# Patient Record
Sex: Female | Born: 1970 | Race: White | Hispanic: No | Marital: Married | Smoking: Never smoker
Health system: Southern US, Community
[De-identification: ages and names within clinical notes are randomized; demographics above are authoritative.]

## PROBLEM LIST (undated history)

## (undated) DIAGNOSIS — F1010 Alcohol abuse, uncomplicated: Secondary | ICD-10-CM

## (undated) DIAGNOSIS — K859 Acute pancreatitis without necrosis or infection, unspecified: Secondary | ICD-10-CM

## (undated) DIAGNOSIS — I10 Essential (primary) hypertension: Secondary | ICD-10-CM

## (undated) DIAGNOSIS — R7401 Elevation of levels of liver transaminase levels: Secondary | ICD-10-CM

## (undated) DIAGNOSIS — K760 Fatty (change of) liver, not elsewhere classified: Secondary | ICD-10-CM

## (undated) DIAGNOSIS — K449 Diaphragmatic hernia without obstruction or gangrene: Secondary | ICD-10-CM

## (undated) DIAGNOSIS — R160 Hepatomegaly, not elsewhere classified: Secondary | ICD-10-CM

## (undated) DIAGNOSIS — K8020 Calculus of gallbladder without cholecystitis without obstruction: Secondary | ICD-10-CM

## (undated) HISTORY — DX: Calculus of gallbladder without cholecystitis without obstruction: K80.20

## (undated) HISTORY — DX: Diaphragmatic hernia without obstruction or gangrene: K44.9

## (undated) HISTORY — DX: Alcohol abuse, uncomplicated: F10.10

## (undated) HISTORY — DX: Acute pancreatitis without necrosis or infection, unspecified: K85.90

## (undated) HISTORY — DX: Fatty (change of) liver, not elsewhere classified: K76.0

## (undated) HISTORY — PX: CHOLECYSTECTOMY: SHX55

## (undated) HISTORY — DX: Hepatomegaly, not elsewhere classified: R16.0

## (undated) HISTORY — DX: Essential (primary) hypertension: I10

## (undated) HISTORY — DX: Elevation of levels of liver transaminase levels: R74.01

---

## 2004-03-10 HISTORY — PX: BACK SURGERY: SHX140

## 2006-12-29 ENCOUNTER — Emergency Department (HOSPITAL_COMMUNITY): Admission: EM | Admit: 2006-12-29 | Discharge: 2006-12-29 | Payer: Self-pay | Admitting: Emergency Medicine

## 2009-05-03 ENCOUNTER — Ambulatory Visit (HOSPITAL_COMMUNITY): Admission: RE | Admit: 2009-05-03 | Discharge: 2009-05-03 | Payer: Self-pay | Admitting: Obstetrics & Gynecology

## 2009-05-10 ENCOUNTER — Ambulatory Visit: Payer: Self-pay | Admitting: Obstetrics & Gynecology

## 2009-05-16 ENCOUNTER — Encounter: Payer: Self-pay | Admitting: Obstetrics and Gynecology

## 2009-05-16 ENCOUNTER — Ambulatory Visit: Payer: Self-pay | Admitting: Obstetrics and Gynecology

## 2009-05-21 ENCOUNTER — Ambulatory Visit: Payer: Self-pay | Admitting: Obstetrics & Gynecology

## 2009-05-21 LAB — CONVERTED CEMR LAB

## 2009-05-22 ENCOUNTER — Encounter: Payer: Self-pay | Admitting: Obstetrics & Gynecology

## 2009-05-22 LAB — CONVERTED CEMR LAB
ALT: 14 units/L (ref 0–35)
AST: 20 units/L (ref 0–37)
Alkaline Phosphatase: 147 units/L — ABNORMAL HIGH (ref 39–117)
BUN: 8 mg/dL (ref 6–23)
CO2: 23 meq/L (ref 19–32)
Collection Interval-CRCL: 24 hr
Creatinine Clearance: 119 mL/min — ABNORMAL HIGH (ref 75–115)
Creatinine, Urine: 84.3 mg/dL
Glucose, Bld: 98 mg/dL (ref 70–99)
Hemoglobin: 12.2 g/dL (ref 12.0–15.0)
MCHC: 31.5 g/dL (ref 30.0–36.0)
Platelets: 224 10*3/uL (ref 150–400)
RBC: 4.97 M/uL (ref 3.87–5.11)
Total Bilirubin: 0.3 mg/dL (ref 0.3–1.2)
Uric Acid, Serum: 4.1 mg/dL (ref 2.4–7.0)

## 2009-05-24 ENCOUNTER — Ambulatory Visit: Payer: Self-pay | Admitting: Advanced Practice Midwife

## 2009-05-24 ENCOUNTER — Inpatient Hospital Stay (HOSPITAL_COMMUNITY): Admission: RE | Admit: 2009-05-24 | Discharge: 2009-05-26 | Payer: Self-pay | Admitting: Obstetrics & Gynecology

## 2009-09-21 ENCOUNTER — Emergency Department (HOSPITAL_COMMUNITY): Admission: EM | Admit: 2009-09-21 | Discharge: 2009-09-21 | Payer: Self-pay | Admitting: Emergency Medicine

## 2010-03-31 ENCOUNTER — Encounter: Payer: Self-pay | Admitting: Obstetrics & Gynecology
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
## 2010-06-03 LAB — POCT URINALYSIS DIP (DEVICE)
Bilirubin Urine: NEGATIVE
Glucose, UA: NEGATIVE mg/dL
Hgb urine dipstick: NEGATIVE
Ketones, ur: NEGATIVE mg/dL
Ketones, ur: NEGATIVE mg/dL
Nitrite: NEGATIVE
pH: 7 (ref 5.0–8.0)

## 2010-06-03 LAB — LACTATE DEHYDROGENASE: LDH: 177 U/L (ref 94–250)

## 2010-06-03 LAB — COMPREHENSIVE METABOLIC PANEL
Alkaline Phosphatase: 158 U/L — ABNORMAL HIGH (ref 39–117)
BUN: 8 mg/dL (ref 6–23)
CO2: 23 mEq/L (ref 19–32)
GFR calc non Af Amer: >60 mL/min (ref >60)
Glucose, Bld: 68 mg/dL — ABNORMAL LOW (ref 70–99)
Potassium: 3.5 mEq/L (ref 3.5–5.1)
Total Bilirubin: 0.1 mg/dL — ABNORMAL LOW (ref 0.3–1.2)
Total Protein: 6.3 g/dL (ref 6.0–8.3)

## 2010-06-03 LAB — URIC ACID: Uric Acid, Serum: 4.3 mg/dL (ref 2.4–7.0)

## 2010-06-03 LAB — CBC
HCT: 38.5 % (ref 36.0–46.0)
MCV: 79.3 fL (ref 78.0–100.0)
Platelets: 222 10*3/uL (ref 150–400)
WBC: 11.2 10*3/uL — ABNORMAL HIGH (ref 4.0–10.5)

## 2010-06-03 LAB — GLUCOSE, CAPILLARY: Glucose-Capillary: 73 mg/dL (ref 70–99)

## 2010-09-03 NOTE — Consult Note (Signed)
[name], [name]            ACCOUNT NO.:  [id number]  MEDICAL RECORD NO.:  [id number]         PATIENT TYPE:  WOUT  LOCATION:                                FACILITY:  [facility]  PHYSICIAN:  Tonica Brasington L. Rachel Bo, MD   DATE OF BIRTH:  1971-01-05  DATE OF CONSULTATION:  05/03/2009 DATE OF DISCHARGE:                                CONSULTATION  ADDRESS:  Kingsley Plan, M.D. [place], [place]  BODY: Dear Dr. Bari Mantis:  Thank you for referring your patient, Sue Brady or maternal fetal medicine consultation.  As you are aware, Sue Brady is a 40 year old gravida  6, para 4-0-1-4 at 35-6/7ths weeks gestation by her last menstrual period consistent with a first trimester ultrasound.  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  Sue Brady was feeling well today and had no specific complaints. She reported good fetal movement and denied significant uterine activity, vaginal bleeding or loss of fluid per vagina.  She additionally had no symptoms consistent with preeclampsia including headaches, visual changes, right upper quadrant epigastric pain or significant peripheral edema.  Sue Brady states that she has been followed  by [name] in a pain management clinic in [place], [place] for several years.  She has been on methadone for pain related to back surgery that she had in 2006. She is currently on 20 mg 3 times a day but is working towards weaning her dose to 40 mg daily.  She reports good control of her pain with this therapy and states a long-term plan to remain on methadone.  Sue Brady denies other medical problems.  Her only other medications include prenatal vitamins and she has no known drug allergies.  In addition to [name] back surgery, she has had a D and C in 2009 after first trimester miscarriage.  OBSTETRICAL HISTORY:  Sue Brady obstetrical history includes 4 term spontaneous vaginal  deliveries.  She reports no antepartum, intrapartum or postpartum complications with these pregnancies and reports that all of her children are currently alive and well.  In addition to these deliveries. Sue Brady had a first trimester spontaneous abortion in 2009 which was managed by D and C.  She had no complications with this procedure.  OBSTETRICAL HISTORY:  Includes a history of abnormal Pap smears for which she has undergone laser therapy to her cervix.  Prenatal Pap smear at this pregnancy in August 2010 was negative.  Sue Brady denies other cervical or uterine procedures with the exception of D and C as described above in 2009.  Sue Brady does not smoke, use alcohol or street drugs.  She reports that she recently moved to [place] and has not established care with an obstetric provider since moving here.  REVIEW OF SYSTEMS:  Negative today.  FAMILY HISTORY:  Sue Brady  family history is negative for  known genetic diseases or congenital anomalies.  She does report the father of the baby's nephew died at 72 months of age from congenital cardiac disease.  Sue Brady vital signs today reveal a blood pressure of 144/91 and on repeat this was 141/88.  Her pulse was 79 beats per minute.  Weight was 169 pounds.  Sue Brady denies a known history of chronic hypertension.  Her prior prenatal records do indicate some mildly elevated blood pressures early in pregnancy that could be consistent with chronic hypertension.  She appeared in no acute distress and as stated above denied any symptoms consistent with preeclampsia.  Sue Brady underwent an  ultrasound today which revealed a singleton fetus in vertex presentation.  Amniotic fluid volume was within normal limits and fetal growth was at the 82nd percentile with an estimated fetal weight of 3084 grams.  Of note, the fetal abdominal circumference was greater than 97th percentile.  Sue Brady reports  undergoing screening for gestational diabetes during this pregnancy with normal results.  Formal report of this is not available for review today.  Fetal anatomic survey was limited by advanced gestational age  but did not reveal any gross structural fetal abnormalities.  Use of methadone during pregnancy was discussed with Sue Brady at length today including risks for fetal growth restriction and neonatal transition and difficulties in the neonatal period with transient difficulties with transition in the neonatal period.  It was discussed with Sue Brady that it is unlikely that her fetus would develop growth restriction at this point given normal findings of normal growth on ultrasound today.  However, it will be important for her to discuss with the pediatricians that will be evaluating the baby in the neonatal period of her methadone use during pregnancy.  Sue Brady stated that she did not know where she was going to deliver and an offer was extended to arrange an appointment for her in the [facility] high risk clinic.  She expressed a desire to do this and she was reassured that the neonatologists at [facility] are experienced in the care of neonates with prenatal methadone exposure.  It was also discussed that each individual fetus may respond in a variable way to prenatal methadone exposure but that her overall dose of methadone was low and that is less likely to result in significant complications in neonatal period.  Sue Brady  states an intention to continue care with [name] in [place], [place] for management of her pain and she was encouraged to continue to do this.  Advanced maternal age was also briefly discussed with Sue Brady today.  She had a quad screen earlier in gestation that revealed no increased risk for fetal chromosome anomalies.  Maternal serum alpha fetoprotein was also within normal limits.  Sue Brady stated an awareness  of the increased risk for fetal chromosome anomalies in her fetus and her prior ultrasound reports indicate no findings consistent with fetal aneuploidy.  The father of this baby's nephew has a  history of congenital cardiac disease.  This likely does not increase the risk for Ms. Vaness fetus to have a congenital cardiac defects.  However, without further information regarding this, an accurate genetic risk cannot be determined. Ms. Jeanbaptiste  was aware of this limitation as well as ultrasound evaluation of her fetus at this late gestational age.  No evidence of congenital cardiac defect is seen on ultrasound today, but again ultrasound was limited.  Ms. Liddicoat elevated blood pressures as well as the possible diagnosis of preeclampsia were discussed.  The signs and symptoms of preeclampsia were reviewed and a CBC, CMP and 24-hour urine for total protein and total creatinine were ordered for Ms. Mata today.  She was advised to present to the maternity admissions unit  should any symptoms of preeclampsia develop before  she is able to be seen in the high risk obstetrics clinic.  An appointment was made for her on May 10, 2009 at 9:30 a.m. to establish obstetric care.  Given her late gestational age, we did not schedule a follow-up ultrasound for Ms. Ricketts.  However,  , we will be pleased to see her back for repeat ultrasound at any time should further concerns arise.  If maternal and fetal status remains stable induction of labor at 39 weeks may be a good option for Ms. Vega.  However, again if maternal fetal status remains stable, awaiting spontaneous labor or 40 weeks' gestation is another acceptable approach.  Thank you for allowing [place] to participate in the care of Ms. Mcgarvey. Please feel free to contact us at any time regarding this or any patient you may have.          ______________________________ Macarthur Critchley. Rachel Bo, MD    HLM/MEDQ  D:  05/03/2009  T:   05/03/2009  Job:  [id number]  Electronically Signed by Rica Koyanagi MD on 09/03/2010 10:31:27 AM

## 2011-06-06 IMAGING — US US OB COMP +14 WK
1 series · 18 of 28 positions shown · non-contrast
Comparison: none

OBSTETRICAL ULTRASOUND:
 This ultrasound was performed in The [HOSPITAL], and the [facility] report will be stored to [REDACTED] PACS.  This report is also available in [HOSPITAL]?s accessANYware.

[Series 1: us ob comp +14 wk · 18 of 54 slices shown]
[im 1/54]
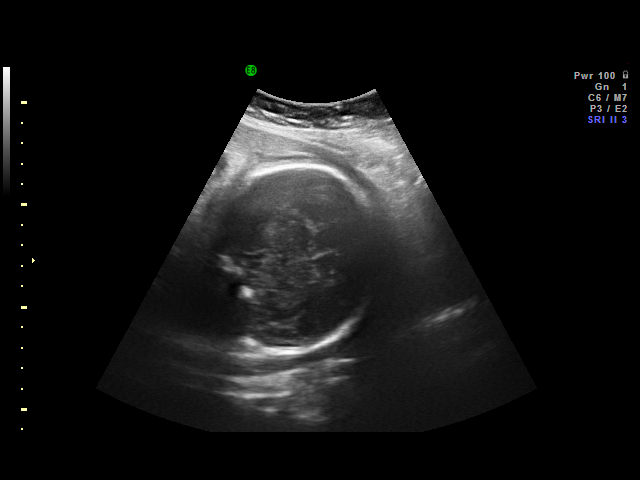
[im 4/54]
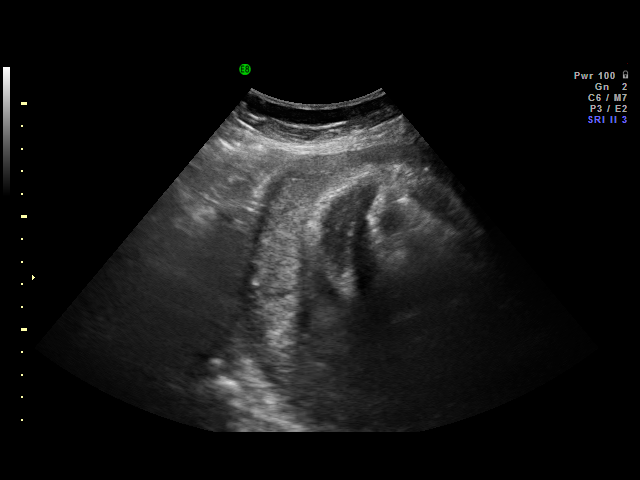
[im 6/54]
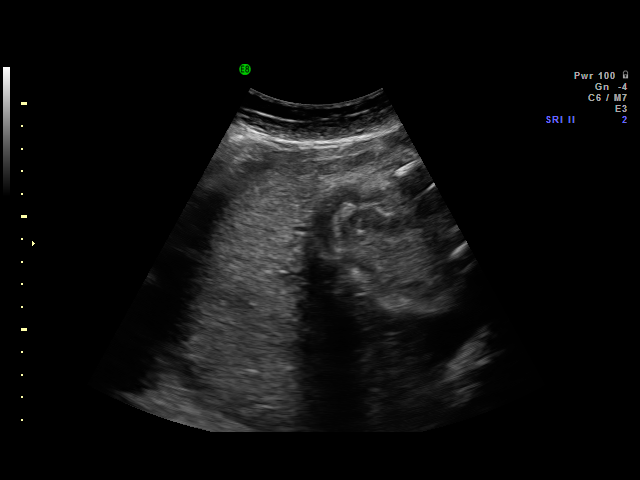
[im 10/54]
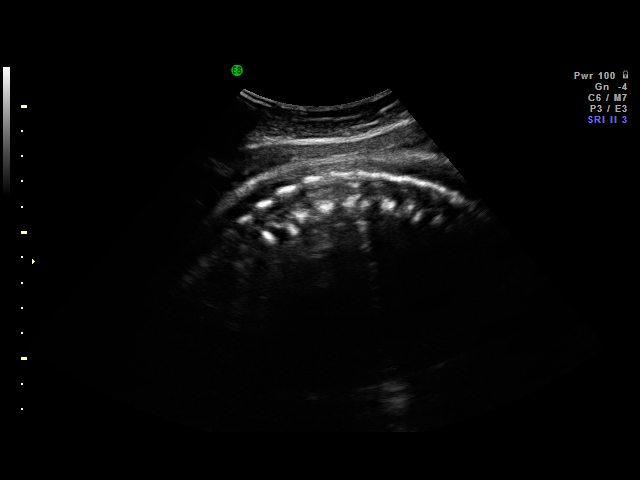
[im 14/54]
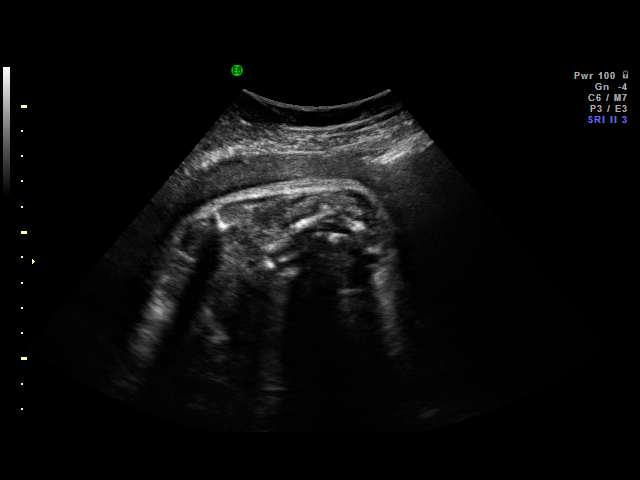
[im 16/54]
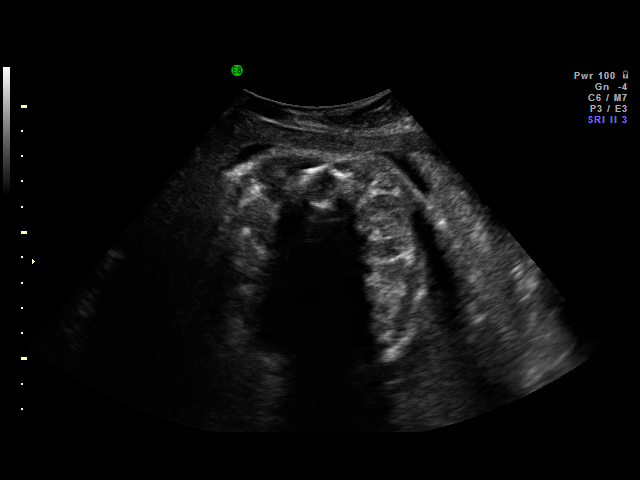
[im 20/54]
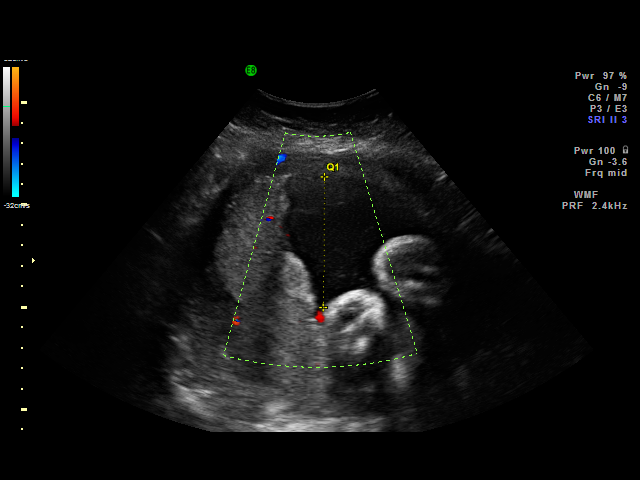
[im 22/54]
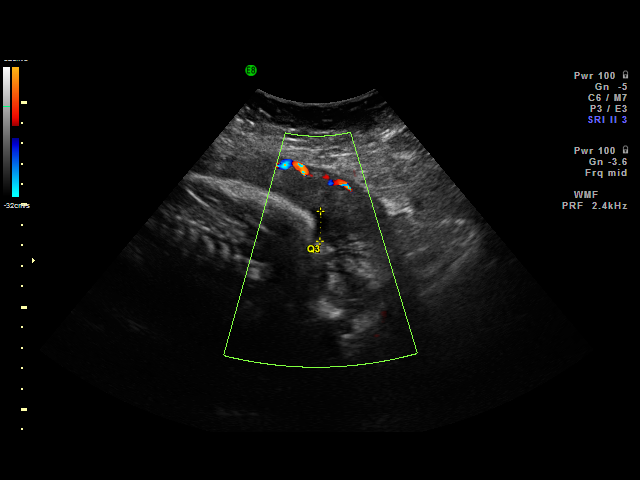
[im 26/54]
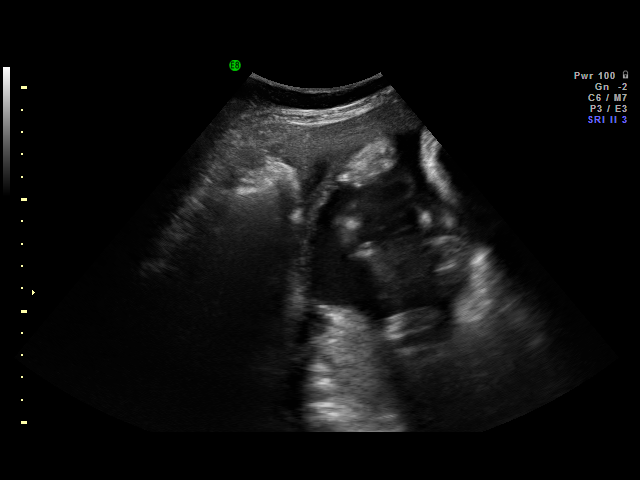
[im 28/54]
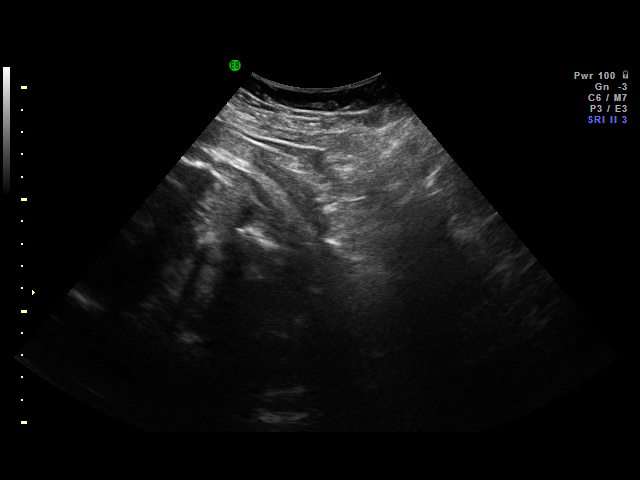
[im 32/54]
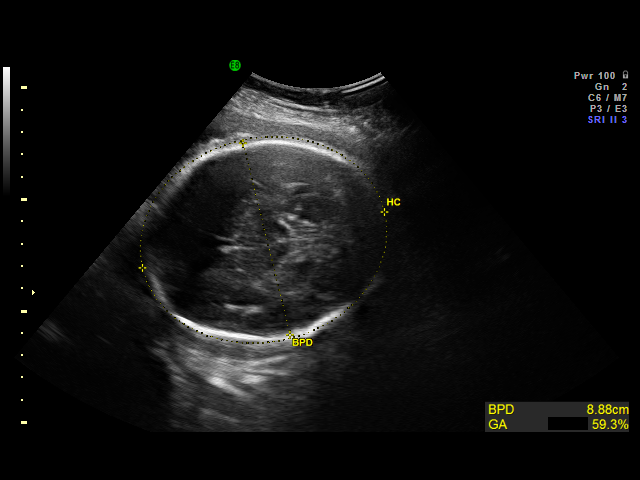
[im 34/54]
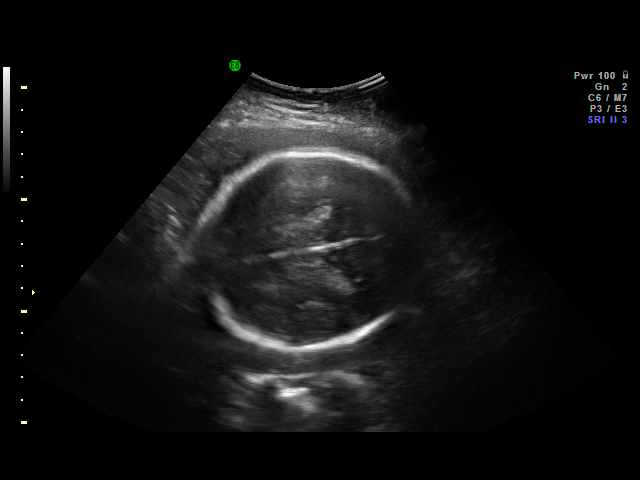
[im 38/54]
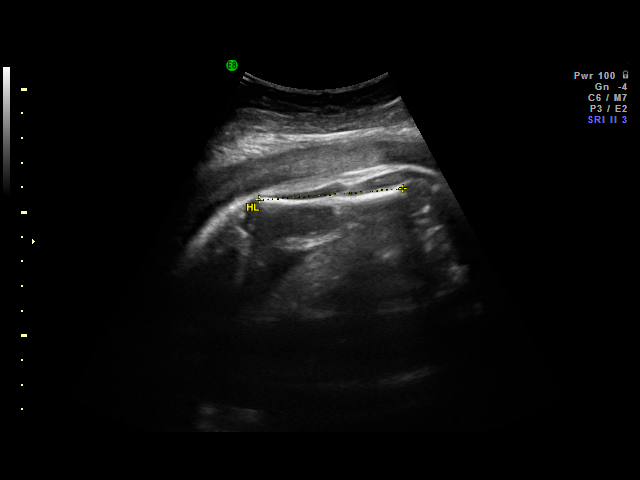
[im 42/54]
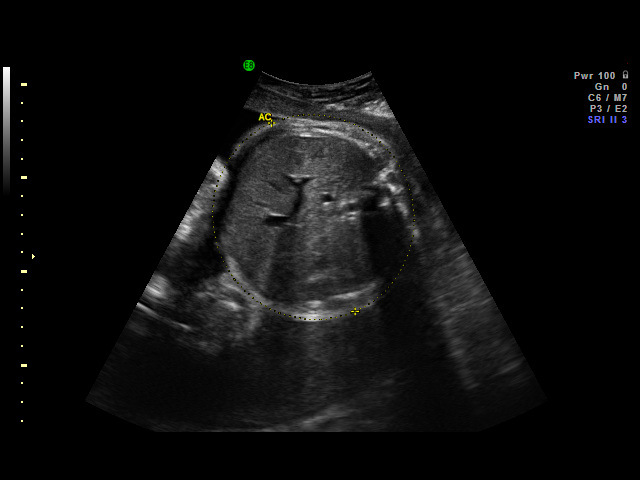
[im 44/54]
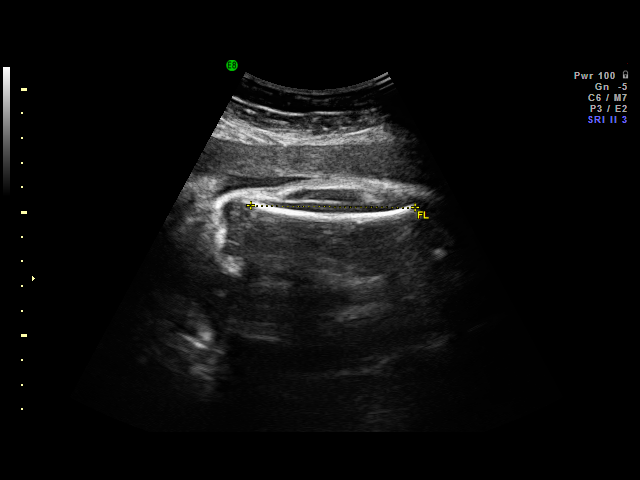
[im 48/54]
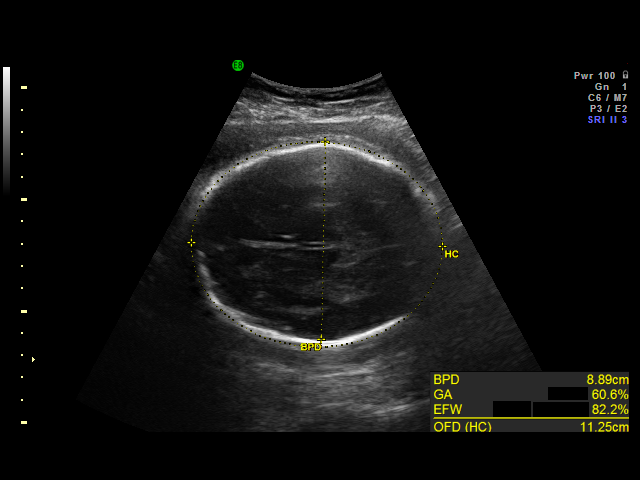
[im 50/54]
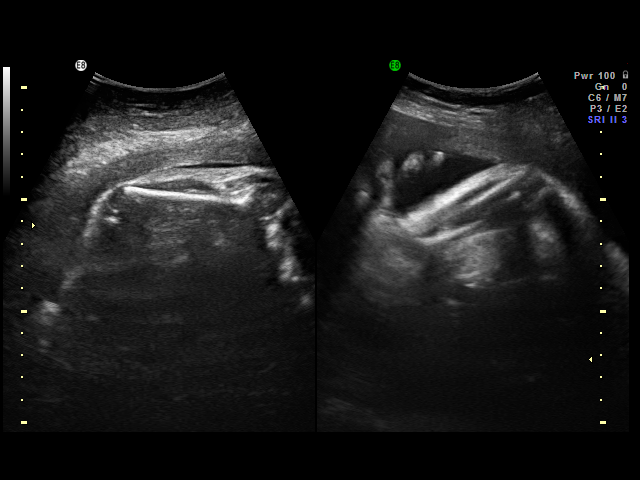
[im 54/54]
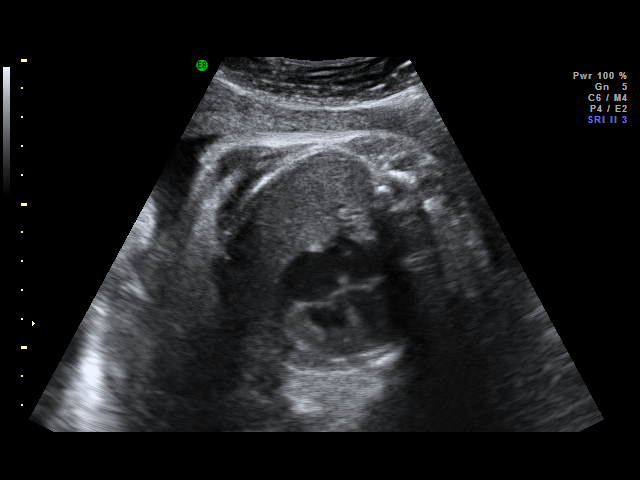

[18 of 28 positions shown; findings below may reference images not displayed]

IMPRESSION: [facility] has also been faxed to the ordering physician.

## 2016-03-03 ENCOUNTER — Other Ambulatory Visit: Payer: Self-pay | Admitting: Psychiatry

## 2016-03-04 NOTE — Telephone Encounter (Signed)
Patient never been seen in the [facility]

## 2016-03-13 ENCOUNTER — Other Ambulatory Visit: Payer: Self-pay | Admitting: Psychiatry

## 2016-03-13 NOTE — Telephone Encounter (Signed)
Patient never been seen in the [facility]

## 2016-04-28 ENCOUNTER — Other Ambulatory Visit: Payer: Self-pay | Admitting: Psychiatry

## 2016-04-29 NOTE — Telephone Encounter (Signed)
Patient never been seen in the [facility]

## 2016-05-07 ENCOUNTER — Other Ambulatory Visit: Payer: Self-pay | Admitting: Psychiatry

## 2016-05-07 NOTE — Telephone Encounter (Signed)
Patient never been seen in the [facility] [facility]

## 2016-05-08 ENCOUNTER — Other Ambulatory Visit: Payer: Self-pay | Admitting: Psychiatry

## 2016-05-08 NOTE — Telephone Encounter (Signed)
Patient never been seen in the [facility]

## 2016-07-09 ENCOUNTER — Other Ambulatory Visit: Payer: Self-pay | Admitting: Psychiatry

## 2016-07-10 NOTE — Telephone Encounter (Signed)
Pt doesn't belong to this location.

## 2016-07-14 ENCOUNTER — Other Ambulatory Visit: Payer: Self-pay | Admitting: Psychiatry

## 2016-07-14 NOTE — Telephone Encounter (Signed)
Patient never been seen in the [facility]

## 2016-08-30 ENCOUNTER — Other Ambulatory Visit: Payer: Self-pay | Admitting: Psychiatry

## 2016-09-01 NOTE — Telephone Encounter (Signed)
Patient never been seen in the [facility]

## 2016-09-17 ENCOUNTER — Other Ambulatory Visit: Payer: Self-pay | Admitting: Psychiatry

## 2016-09-17 NOTE — Telephone Encounter (Signed)
Patient never been seen in the [facility]

## 2016-09-24 ENCOUNTER — Other Ambulatory Visit: Payer: Self-pay | Admitting: Psychiatry

## 2016-09-24 NOTE — Telephone Encounter (Signed)
Patient never been seen in the [facility]

## 2016-10-05 ENCOUNTER — Other Ambulatory Visit: Payer: Self-pay | Admitting: Psychiatry

## 2016-10-06 NOTE — Telephone Encounter (Signed)
Patient never been seen in the [place] [facility]

## 2016-11-30 ENCOUNTER — Other Ambulatory Visit: Payer: Self-pay | Admitting: Psychiatry

## 2016-12-01 NOTE — Telephone Encounter (Signed)
Not a [facility] patient

## 2016-12-11 ENCOUNTER — Other Ambulatory Visit: Payer: Self-pay | Admitting: Psychiatry

## 2016-12-11 NOTE — Telephone Encounter (Signed)
Not a [facility] patient, seen at [facility]

## 2016-12-29 ENCOUNTER — Other Ambulatory Visit: Payer: Self-pay | Admitting: Psychiatry

## 2016-12-29 NOTE — Telephone Encounter (Signed)
Not a patient in this clinic

## 2017-03-04 ENCOUNTER — Other Ambulatory Visit: Payer: Self-pay | Admitting: Psychiatry

## 2017-03-05 ENCOUNTER — Other Ambulatory Visit: Payer: Self-pay | Admitting: Psychiatry

## 2017-03-06 NOTE — Telephone Encounter (Signed)
Not a [facility] pt

## 2017-03-22 ENCOUNTER — Other Ambulatory Visit: Payer: Self-pay | Admitting: Psychiatry

## 2017-04-30 ENCOUNTER — Other Ambulatory Visit: Payer: Self-pay | Admitting: Psychiatry

## 2017-04-30 NOTE — Telephone Encounter (Signed)
Patient never been seen in the [facility]

## 2017-06-15 ENCOUNTER — Other Ambulatory Visit: Payer: Self-pay | Admitting: Psychiatry

## 2017-06-15 NOTE — Telephone Encounter (Signed)
Patient never been seen in the [facility]

## 2017-07-13 ENCOUNTER — Other Ambulatory Visit: Payer: Self-pay | Admitting: Psychiatry

## 2017-07-13 NOTE — Telephone Encounter (Signed)
Patient never been seen in the [facility]

## 2017-07-23 ENCOUNTER — Other Ambulatory Visit: Payer: Self-pay | Admitting: Psychiatry

## 2017-07-23 NOTE — Telephone Encounter (Signed)
Patient never been seen in the [facility]

## 2017-09-07 ENCOUNTER — Other Ambulatory Visit: Payer: Self-pay | Admitting: Psychiatry

## 2017-09-07 NOTE — Telephone Encounter (Signed)
Not a patient in this clinic

## 2017-09-17 ENCOUNTER — Other Ambulatory Visit: Payer: Self-pay | Admitting: Psychiatry

## 2017-09-17 NOTE — Telephone Encounter (Signed)
Patient never been seen in the [facility]

## 2018-01-02 ENCOUNTER — Other Ambulatory Visit: Payer: Self-pay | Admitting: Psychiatry

## 2018-01-04 NOTE — Telephone Encounter (Signed)
Not a patient in this clinic

## 2018-03-15 ENCOUNTER — Other Ambulatory Visit: Payer: Self-pay | Admitting: Psychiatry

## 2018-03-15 NOTE — Telephone Encounter (Signed)
Patient never been seen in the [facility]

## 2018-04-12 ENCOUNTER — Other Ambulatory Visit: Payer: Self-pay | Admitting: Psychiatry

## 2018-04-12 NOTE — Telephone Encounter (Signed)
Patient never been seen in the [facility]

## 2018-06-07 ENCOUNTER — Other Ambulatory Visit: Payer: Self-pay | Admitting: Psychiatry

## 2018-06-07 NOTE — Telephone Encounter (Signed)
Patient never been seen in the [facility]

## 2018-06-17 ENCOUNTER — Other Ambulatory Visit: Payer: Self-pay | Admitting: Psychiatry

## 2018-06-17 NOTE — Telephone Encounter (Signed)
Patient never been seen in the [facility] [facility]

## 2019-09-28 ENCOUNTER — Encounter: Payer: Self-pay | Admitting: Internal Medicine

## 2019-11-02 ENCOUNTER — Encounter: Payer: Self-pay | Admitting: Internal Medicine

## 2019-11-02 ENCOUNTER — Ambulatory Visit: Payer: Self-pay | Admitting: Gastroenterology

## 2019-12-14 ENCOUNTER — Encounter (INDEPENDENT_AMBULATORY_CARE_PROVIDER_SITE_OTHER): Payer: Self-pay | Admitting: Gastroenterology

## 2020-01-12 ENCOUNTER — Ambulatory Visit (INDEPENDENT_AMBULATORY_CARE_PROVIDER_SITE_OTHER): Payer: Medicare Other | Admitting: Gastroenterology

## 2020-01-12 ENCOUNTER — Encounter (INDEPENDENT_AMBULATORY_CARE_PROVIDER_SITE_OTHER): Payer: Self-pay | Admitting: Gastroenterology

## 2020-01-12 ENCOUNTER — Other Ambulatory Visit: Payer: Self-pay

## 2020-01-12 DIAGNOSIS — R110 Nausea: Secondary | ICD-10-CM | POA: Diagnosis not present

## 2020-01-12 DIAGNOSIS — R1013 Epigastric pain: Secondary | ICD-10-CM

## 2020-01-12 DIAGNOSIS — K859 Acute pancreatitis without necrosis or infection, unspecified: Secondary | ICD-10-CM | POA: Diagnosis not present

## 2020-01-12 DIAGNOSIS — G8929 Other chronic pain: Secondary | ICD-10-CM

## 2020-01-12 LAB — CBC WITH DIFFERENTIAL/PLATELET
Eosinophils Relative: 0.6 %
Hemoglobin: 14.2 g/dL (ref 11.7–15.5)

## 2020-01-12 MED ORDER — HYOSCYAMINE SULFATE 0.125 MG SL SUBL: 0.1250 mg | SUBLINGUAL_TABLET | SUBLINGUAL | 0 refills | Status: AC | PRN

## 2020-01-12 MED ORDER — ONDANSETRON HCL 4 MG PO TABS: 4.0000 mg | ORAL_TABLET | Freq: Three times a day (TID) | ORAL | 1 refills | Status: AC | PRN

## 2020-01-12 NOTE — Patient Instructions (Signed)
Schedule CT abdomen/pelvis with IV contrast Perform blood workup Start Zofran 4 mg q8h as needed for nausea Start Levsin 1 tablet q8h as needed for abdominal pain

## 2020-01-12 NOTE — Progress Notes (Signed)
Sue Brady, M.D. Gastroenterology & Hepatology [facility] [address] [place], [place] [address] Primary Care Physician: Mechele Claude, MD [address] [place] [address]  Referring MD: PCP  I will communicate my assessment and recommendations to the referring MD via EMR. Note: Occasional unusual wording and randomly placed punctuation marks may result from the use of speech recognition technology to transcribe this document"  Chief Complaint: Acute pancreatitis  History of Present Illness: Sue Brady is a 50 y.o. female with past medical history of alcoholic abuse, hypertension, and recent episode of acute pancreatitis, who presents for evaluation of after hospitalization at [facility] for acute pancreatitis.  Was hospitalized at [facility] in June 2021 after presenting new onset of epigastric abdominal pain along with persistent nausea and vomiting.  She was found to have imaging findings consistent with acute pancreatitis, with presence of a markedly elevated lipase of 4400.  The patient was hospitalized for close to 6 weeks.  Her hospital course was complicated by poor oral intake, she was placed on TPN on 08/23/2019.  Due to poor oral intake, the patient underwent an EGD on 08/31/2019, patient was found to have chronic gastritis which was biopsied (unclear if she had H. pylori or any other alteration) and a medium size hiatal hernia.  Patient was able to advance her diet and 09/03/2018 and was discharged from the hospital after a few days of tolerating diet.  However she was readmitted after a few weeks and was discharged on the first week of July from the hospital again.  Report from her discharge summary shows reading of CT of the abdomen from admission that showed no presence of biliary ductal dilation but there was only presence of hepatomegaly and steatosis of the liver.  Patient states that in March 2021 she was drinking  more than usual as she was drinking on a weekly basis for couple months, mostly on the weekends.  She endorses that occasionally she drank on a daily basis 1 glass of wine at night but these were not frequent.  States that she has not drank any alcohol since her hospitalization.   States that she has persisted with epigastric and right upper quadrant pain that radiates to her back and is very uncomfortable.  She states that the pain is stabbing in nature and does not improve with medication so far.  Has not presented any vomiting episodes since her discharge from the hospital but has felt nauseated.  Notably, the patient reported that before her symptoms of pancreatitis started, she had presented on and off episodes of diarrhea for a few months, states that it is watery to soft in consistency without any blood but denies any steatorrhea.  States that for a few days the stool had claylike color.  Now the color has normalized.  She is concerned as she has lost close to 30 pounds since her hospitalization.    She has not been eating well as the food worsen her symptoms.  The patient denies having any fever, chills, hematochezia, melena, hematemesis, diarrhea, jaundice, pruritus.  Last Colonoscopy: Never  FHx: Father had an episode of acute pancreatitis in the past of unclear source, neg for any gastrointestinal/liver disease, no malignancies Social: neg smoking, or illicit drug use Surgical: Cholecystectomy 2 years ago.  Past Medical History: Past Medical History:  Diagnosis Date  . Acute pancreatitis   . Alcohol abuse   . Cholelithiasis   . Hepatic steatosis   .  Hepatomegaly   . Hiatal hernia   . Hypertension   . Transaminasemia     Past Surgical History: Past Surgical History:  Procedure Laterality Date  . BACK SURGERY Bilateral 2006   decompression of L-4 and L-5  . CHOLECYSTECTOMY      Family History: Family History  Problem Relation Age of Onset  . Healthy Mother   . Heart  disease Father   . Hypertension Father   . Healthy Sister   . Healthy Brother     Social History: Social History   Tobacco Use  Smoking Status Never Smoker  Smokeless Tobacco Never Used   Social History   Substance and Sexual Activity  Alcohol Use Not Currently   Social History   Substance and Sexual Activity  Drug Use Never    Allergies: Allergies  Allergen Reactions  . Tramadol     Low bp    Medications: No current outpatient medications on file.   No current facility-administered medications for this visit.    Review of Systems: GENERAL: negative for malaise, night sweats HEENT: No changes in hearing or vision, no nose bleeds or other nasal problems. NECK: Negative for lumps, goiter, pain and significant neck swelling RESPIRATORY: Negative for cough, wheezing CARDIOVASCULAR: Negative for chest pain, leg swelling, palpitations, orthopnea GI: SEE HPI MUSCULOSKELETAL: Negative for joint pain or swelling, back pain, and muscle pain. SKIN: Negative for lesions, rash PSYCH: Negative for sleep disturbance, mood disorder and recent psychosocial stressors. HEMATOLOGY Negative for prolonged bleeding, bruising easily, and swollen nodes. ENDOCRINE: Negative for cold or heat intolerance, polyuria, polydipsia and goiter. NEURO: negative for tremor, gait imbalance, syncope and seizures. The remainder of the review of systems is noncontributory.   Physical Exam: BP (!) 152/112 (BP Location: Left Arm, Patient Position: Sitting, Cuff Size: Normal)   Pulse 93   Temp 98.8 F (37.1 C) (Oral)   Ht 5' 2.99" (1.6 m)   Wt 149 lb 8 oz (67.8 kg)   BMI 26.49 kg/m  GENERAL: The patient is AO x3, in no acute distress. HEENT: Head is normocephalic and atraumatic. EOMI are intact. Mouth is well hydrated and without lesions. NECK: Supple. No masses LUNGS: Clear to auscultation. No presence of rhonchi/wheezing/rales. Adequate chest expansion HEART: RRR, normal s1 and s2. ABDOMEN:  tender to palpation in the upper abdominal area and right side of back but pain is worse in the epigastric region, no guarding, no peritoneal signs, and nondistended. BS +. No masses. EXTREMITIES: Without any cyanosis, clubbing, rash, lesions or edema. NEUROLOGIC: AOx3, no focal motor deficit. SKIN: no jaundice, no rashes   Imaging/Labs: as above  I personally reviewed and interpreted the available labs, imaging and endoscopic files.  Impression and Plan: LEILANIE RAUDA is a 49 y.o. female with past medical history of alcoholic abuse, hypertension, and recent episode of acute pancreatitis, who presents for evaluation of after hospitalization at [facility] for acute pancreatitis.  The patient had an episode of acute pancreatitis of unclear source.  I doubt that her episode was related to alcohol intake as she was not chronically taking alcohol on a frequent basis but it was only a short period of time.  The patient will need to have other causes ruled out such as I reviewed pancreatitis or hypertriglyceridemia.  She has not been taking any medication or herbal supplements that could have caused her presentation.  Given the persistence and recent worsening of her pain, I will repeat an abdominal CT scan with IV contrast to rule  out any complication or persistence of the inflammation.  We will also recheck her most recent CBC and CMP.  Finally, the patient will receive symptomatic management with Levsin as needed for abdominal pain and Zofran for her nausea episodes.  The patient understood and agreed.  - Schedule CT abdomen/pelvis with IV contrast - Check IgG4, CMP, CBC and TGC - Start Zofran 4 mg q8h as needed for nausea - Start Levsin 1 tablet q8h as needed for abdominal pain - RTC 4 weeks  All questions were answered.      Sue Blazing, MD Gastroenterology and Hepatology [facility]\

## 2020-01-13 LAB — COMPREHENSIVE METABOLIC PANEL
AG Ratio: 1.8 (calc) (ref 1.0–2.5)
ALT: 16 U/L (ref 6–29)
AST: 19 U/L (ref 10–35)
Albumin: 4.5 g/dL (ref 3.6–5.1)
Alkaline phosphatase (APISO): 72 U/L (ref 37–153)
BUN: 13 mg/dL (ref 7–25)
CO2: 27 mmol/L (ref 20–32)
Calcium: 10 mg/dL (ref 8.6–10.4)
Chloride: 102 mmol/L (ref 98–110)
Creat: 0.7 mg/dL (ref 0.50–1.05)
Globulin: 2.5 g/dL (calc) (ref 1.9–3.7)
Glucose, Bld: 98 mg/dL (ref 65–99)
Potassium: 4.1 mmol/L (ref 3.5–5.3)
Sodium: 141 mmol/L (ref 135–146)
Total Bilirubin: 0.4 mg/dL (ref 0.2–1.2)
Total Protein: 7 g/dL (ref 6.1–8.1)

## 2020-01-13 LAB — CBC WITH DIFFERENTIAL/PLATELET
Absolute Monocytes: 569 cells/uL (ref 200–950)
Basophils Absolute: 40 cells/uL (ref 0–200)
Basophils Relative: 0.5 %
Eosinophils Absolute: 47 cells/uL (ref 15–500)
HCT: 44.6 % (ref 35.0–45.0)
Lymphs Abs: 2054 cells/uL (ref 850–3900)
MCH: 26.1 pg — ABNORMAL LOW (ref 27.0–33.0)
MCHC: 31.8 g/dL — ABNORMAL LOW (ref 32.0–36.0)
MCV: 81.8 fL (ref 80.0–100.0)
MPV: 10 fL (ref 7.5–12.5)
Monocytes Relative: 7.2 %
Neutro Abs: 5190 cells/uL (ref 1500–7800)
Neutrophils Relative %: 65.7 %
Platelets: 266 10*3/uL (ref 140–400)
RBC: 5.45 10*6/uL — ABNORMAL HIGH (ref 3.80–5.10)
RDW: 13 % (ref 11.0–15.0)
Total Lymphocyte: 26 %
WBC: 7.9 10*3/uL (ref 3.8–10.8)

## 2020-01-13 LAB — IGG SUBCLASSES
IgG Subclass 1: 458 mg/dL (ref 382–929)
IgG Subclass 2: 334 mg/dL (ref 241–700)
IgG Subclass 3: 43 mg/dL (ref 22–178)
IgG Subclass 4: 34.2 mg/dL (ref 4–86)
Immunoglobulin G, Serum: 985 mg/dL (ref 600–1640)

## 2020-01-13 LAB — TRIGLYCERIDES: Triglycerides: 94 mg/dL (ref <150)

## 2020-01-19 ENCOUNTER — Encounter (INDEPENDENT_AMBULATORY_CARE_PROVIDER_SITE_OTHER): Payer: Self-pay

## 2020-01-19 ENCOUNTER — Encounter (INDEPENDENT_AMBULATORY_CARE_PROVIDER_SITE_OTHER): Payer: Self-pay | Admitting: *Deleted

## 2020-01-20 ENCOUNTER — Encounter (INDEPENDENT_AMBULATORY_CARE_PROVIDER_SITE_OTHER): Payer: Self-pay

## 2020-02-06 ENCOUNTER — Telehealth (INDEPENDENT_AMBULATORY_CARE_PROVIDER_SITE_OTHER): Payer: Self-pay | Admitting: Gastroenterology

## 2020-02-06 NOTE — Telephone Encounter (Signed)
Tried calling the patient no answer

## 2020-02-06 NOTE — Telephone Encounter (Signed)
Called patient two times, did not answer the phone.  Katrinka Blazing, MD Gastroenterology and Hepatology [facility]

## 2020-02-06 NOTE — Telephone Encounter (Signed)
Patient called regarding test results - please advise - ph# [phone]

## 2020-02-21 ENCOUNTER — Telehealth (INDEPENDENT_AMBULATORY_CARE_PROVIDER_SITE_OTHER): Payer: Self-pay

## 2020-02-21 ENCOUNTER — Other Ambulatory Visit (INDEPENDENT_AMBULATORY_CARE_PROVIDER_SITE_OTHER): Payer: Self-pay

## 2020-02-21 NOTE — Telephone Encounter (Signed)
Yes Peer to Peer can be done [phone] Ref # [id number]

## 2020-02-21 NOTE — Telephone Encounter (Signed)
Can we try to appeal for it? Please let the patient know we will try this. If not, can you try to set up a peer to peer? Thanks

## 2020-02-21 NOTE — Telephone Encounter (Signed)
[employer] denied Ct abd/pelvis stated it was not a medical necessity

## 2020-03-12 NOTE — Telephone Encounter (Signed)
Can you please call and set up a peer to peer appointment to discuss this?  Thanks, Katrinka Blazing, MD Gastroenterology and Hepatology [facility]

## 2021-08-28 ENCOUNTER — Encounter (INDEPENDENT_AMBULATORY_CARE_PROVIDER_SITE_OTHER): Payer: Self-pay | Admitting: *Deleted

## 2021-09-12 ENCOUNTER — Ambulatory Visit: Payer: Medicare (Managed Care) | Admitting: Family Medicine

## 2021-10-03 ENCOUNTER — Ambulatory Visit (INDEPENDENT_AMBULATORY_CARE_PROVIDER_SITE_OTHER): Payer: Medicare (Managed Care) | Admitting: Gastroenterology

## 2023-12-09 DEATH — deceased
